# Patient Record
Sex: Female | Born: 1965 | Race: White | Hispanic: No | Marital: Married
Health system: Southern US, Community
[De-identification: ages and names within clinical notes are randomized; demographics above are authoritative.]

## PROBLEM LIST (undated history)

## (undated) HISTORY — PX: APPENDECTOMY: SHX54

---

## 1998-03-06 ENCOUNTER — Other Ambulatory Visit: Admission: RE | Admit: 1998-03-06 | Discharge: 1998-03-06 | Payer: Self-pay | Admitting: Obstetrics and Gynecology

## 1999-03-12 ENCOUNTER — Emergency Department (HOSPITAL_COMMUNITY): Admission: EM | Admit: 1999-03-12 | Discharge: 1999-03-12 | Payer: Self-pay | Admitting: Emergency Medicine

## 1999-07-17 ENCOUNTER — Ambulatory Visit (HOSPITAL_BASED_OUTPATIENT_CLINIC_OR_DEPARTMENT_OTHER): Admission: RE | Admit: 1999-07-17 | Discharge: 1999-07-17 | Payer: Self-pay | Admitting: Plastic Surgery

## 2000-06-05 ENCOUNTER — Encounter (INDEPENDENT_AMBULATORY_CARE_PROVIDER_SITE_OTHER): Payer: Self-pay

## 2000-06-05 ENCOUNTER — Ambulatory Visit (HOSPITAL_COMMUNITY): Admission: RE | Admit: 2000-06-05 | Discharge: 2000-06-05 | Payer: Self-pay | Admitting: Obstetrics and Gynecology

## 2000-06-27 ENCOUNTER — Inpatient Hospital Stay (HOSPITAL_COMMUNITY): Admission: AD | Admit: 2000-06-27 | Discharge: 2000-06-27 | Payer: Self-pay | Admitting: Obstetrics and Gynecology

## 2000-11-26 ENCOUNTER — Inpatient Hospital Stay (HOSPITAL_COMMUNITY): Admission: AD | Admit: 2000-11-26 | Discharge: 2000-11-26 | Payer: Self-pay | Admitting: Obstetrics & Gynecology

## 2000-11-26 ENCOUNTER — Encounter: Payer: Self-pay | Admitting: Obstetrics & Gynecology

## 2001-02-24 ENCOUNTER — Inpatient Hospital Stay (HOSPITAL_COMMUNITY): Admission: AD | Admit: 2001-02-24 | Discharge: 2001-02-24 | Payer: Self-pay | Admitting: Obstetrics and Gynecology

## 2001-04-01 ENCOUNTER — Inpatient Hospital Stay (HOSPITAL_COMMUNITY): Admission: AD | Admit: 2001-04-01 | Discharge: 2001-04-01 | Payer: Self-pay | Admitting: *Deleted

## 2001-04-01 ENCOUNTER — Encounter: Payer: Self-pay | Admitting: *Deleted

## 2001-04-16 ENCOUNTER — Inpatient Hospital Stay (HOSPITAL_COMMUNITY): Admission: AD | Admit: 2001-04-16 | Discharge: 2001-04-18 | Payer: Self-pay | Admitting: Obstetrics and Gynecology

## 2001-04-19 ENCOUNTER — Encounter: Admission: RE | Admit: 2001-04-19 | Discharge: 2001-04-21 | Payer: Self-pay | Admitting: Obstetrics and Gynecology

## 2001-05-27 ENCOUNTER — Other Ambulatory Visit: Admission: RE | Admit: 2001-05-27 | Discharge: 2001-05-27 | Payer: Self-pay | Admitting: Obstetrics and Gynecology

## 2002-06-02 ENCOUNTER — Inpatient Hospital Stay (HOSPITAL_COMMUNITY): Admission: AD | Admit: 2002-06-02 | Discharge: 2002-06-04 | Payer: Self-pay | Admitting: Obstetrics and Gynecology

## 2002-06-08 ENCOUNTER — Inpatient Hospital Stay (HOSPITAL_COMMUNITY): Admission: AD | Admit: 2002-06-08 | Discharge: 2002-06-08 | Payer: Self-pay | Admitting: Obstetrics and Gynecology

## 2002-06-11 ENCOUNTER — Inpatient Hospital Stay (HOSPITAL_COMMUNITY): Admission: AD | Admit: 2002-06-11 | Discharge: 2002-06-11 | Payer: Self-pay | Admitting: Obstetrics and Gynecology

## 2003-01-12 ENCOUNTER — Other Ambulatory Visit: Admission: RE | Admit: 2003-01-12 | Discharge: 2003-01-12 | Payer: Self-pay | Admitting: Obstetrics and Gynecology

## 2004-05-01 ENCOUNTER — Other Ambulatory Visit: Admission: RE | Admit: 2004-05-01 | Discharge: 2004-05-01 | Payer: Self-pay | Admitting: Obstetrics and Gynecology

## 2004-05-31 ENCOUNTER — Encounter: Admission: RE | Admit: 2004-05-31 | Discharge: 2004-05-31 | Payer: Self-pay | Admitting: Family Medicine

## 2005-01-17 ENCOUNTER — Emergency Department (HOSPITAL_COMMUNITY): Admission: EM | Admit: 2005-01-17 | Discharge: 2005-01-17 | Payer: Self-pay | Admitting: Emergency Medicine

## 2005-01-18 ENCOUNTER — Encounter (INDEPENDENT_AMBULATORY_CARE_PROVIDER_SITE_OTHER): Payer: Self-pay | Admitting: *Deleted

## 2005-01-18 ENCOUNTER — Inpatient Hospital Stay (HOSPITAL_COMMUNITY): Admission: EM | Admit: 2005-01-18 | Discharge: 2005-01-21 | Payer: Self-pay | Admitting: Emergency Medicine

## 2005-01-21 ENCOUNTER — Inpatient Hospital Stay (HOSPITAL_COMMUNITY): Admission: AD | Admit: 2005-01-21 | Discharge: 2005-01-27 | Payer: Self-pay | Admitting: General Surgery

## 2005-01-29 ENCOUNTER — Ambulatory Visit (HOSPITAL_COMMUNITY): Admission: RE | Admit: 2005-01-29 | Discharge: 2005-01-29 | Payer: Self-pay | Admitting: General Surgery

## 2007-10-11 ENCOUNTER — Encounter: Admission: RE | Admit: 2007-10-11 | Discharge: 2007-10-11 | Payer: Self-pay | Admitting: Obstetrics and Gynecology

## 2009-01-29 ENCOUNTER — Encounter
Admission: RE | Admit: 2009-01-29 | Discharge: 2009-01-29 | Payer: Self-pay | Admitting: Physical Medicine and Rehabilitation

## 2010-12-29 ENCOUNTER — Encounter: Payer: Self-pay | Admitting: Physical Medicine and Rehabilitation

## 2010-12-29 ENCOUNTER — Encounter: Payer: Self-pay | Admitting: Obstetrics and Gynecology

## 2011-04-25 NOTE — Discharge Summary (Signed)
[facility]  Patient:    Lauren, Sparks Visit Number: [id number] MRN: [id number]          Service Type: MED Location: [facility] Attending Physician:  Frederich Balding Dictated by:   Sharene Skeans, N.P. Admit Date:  06/11/2002 Discharge Date: 06/11/2002                             Discharge Summary  ADMISSION DIAGNOSES: 1. Intrauterine pregnancy at 39 weeks. 2. Previous cesarean delivery with desired repeat.  DISCHARGE DIAGNOSES: 1. Status post cesarean delivery. 2. Viable female infant.  PROCEDURE:  Repeat low transverse cesarean section.  REASON FOR ADMISSION:  Please see dictated H&P.  HOSPITAL COURSE:  The patient was admitted for a scheduled repeat cesarean delivery.  The patient was taken to the operating room where epidural anesthesia was placed.  Because of difficulty obtaining a good level of anesthesia, a local anesthetic was placed below the incisional site.  Still unable to achieve good anesthesia, the patient was repositioned and a spinal anesthesia was placed.  After the patient was comfortable, a low transverse incision was made with delivery of a viable female infant weighing 6 pounds 13 ounces, with Apgars of 8 at one minute and 9 at five minutes.  The patient tolerated the procedure well, and was taken to the recovery room in stable condition.  On postoperative day #1, the patient had good return of bowel function, abdomen was soft, incision was noted to be clean, dry, and intact. Labs revealed a hemoglobin of 12.1, hematocrit of 35.2, and white blood cell count of 12.6.  On postoperative day #2, the patient was doing well, incision was clean, dry, and intact, staples were removed, and the patient was discharged home.  CONDITION ON DISCHARGE:  Good.  DIET:  Regular as tolerated.  ACTIVITY:  No heavy lifting, no driving x2 weeks, no vaginal entry.  FOLLOWUP:  The patient is to follow up in the office in one to  two weeks for an incision check.  DISCHARGE INSTRUCTIONS:  The patient is to call for a temperature greater than 100 degrees, persistent nausea and vomiting, heavy vaginal bleeding, and/or redness or drainage from the incision site.  DISCHARGE MEDICATIONS: 1. Percocet 5/325 mg #30 one or two q.4-6h. p.r.n. pain. 2. Prenatal vitamins one p.o. q.d. 3. Ibuprofen 600 mg q.6h. p.r.n. Dictated by:   Sharene Skeans, N.P. Attending Physician:  Frederich Balding DD:  06/28/02 TD:  07/04/02 Job: [id number]

## 2011-04-25 NOTE — Op Note (Signed)
[facility]  Patient:    [name], [name]                MRN: [id number] Proc. Date: 06/05/00 Adm. Date:  [id number] Attending:  Conley Simmonds A                           Operative Report  PREOPERATIVE DIAGNOSIS:       Missed abortion at 9+[redacted] weeks gestation.  POSTOPERATIVE DIAGNOSIS:      Missed abortion at 9+[redacted] weeks gestation.  OPERATION:                    Examination under anesthesia, dilatation and evacuation.  SURGEON:                      Conley Simmonds, M.D.  ASSISTANT:  ANESTHESIA:                   MAC, paracervical block with 1% lidocaine.  IV FLUIDS:                    1300 cc of Ringers lactate.  ESTIMATED BLOOD LOSS:         Minimal.  URINE OUTPUT:                 150 cc prior to procedure.  COMPLICATIONS:                None.  INDICATIONS:                  The patient was a 45 year old, gravida 4, para 1-0-2-1, Caucasian female at 9+[redacted] weeks gestation by a last menstrual period of March 31, 2000, and a six-week ultrasound, who presented to the office noting decreased breast tenderness.  The patient had experienced some sporadic cramping and no bleeding nor passage of any tissue.  The patient had previously had an OB ultrasound documenting a viable intrauterine pregnancy at [redacted] weeks gestation. The patient was interested in proceeding with chorionic villus sampling and an OB ultrasound was therefore performed to document continued viability of the pregnancy given the patients symptoms.  Ultrasound demonstrated an echogenic focus of 6.1 mm and no evidence of an embryo and no cardiac activity.  The gestational sac was noted to be intact.  The patient was given a diagnosis of a missed abortion, and options for care were discussed with the patient.  She chose to proceed with a dilatation and evacuation after the risks and benefits were reviewed.  The patients blood type was noted to be AB positive and her preoperative  hematocrit  was 37%.  FINDINGS:                     Examination under anesthesia revealed an 8-week size anteverted mobile uterus.  No adnexal masses were appreciated.  A small amount f products of conception were obtained, and the uterus was sounded to a total of  cm.  DESCRIPTION OF PROCEDURE:     The patient was greeted in the preoperative holding area, where a transabdominal ultrasound was performed which again documented the absence of a viable intrauterine pregnancy.  The patient received Cefazolin 1 gram intravenously and she was then escorted to the operating room.  The patient received MAC anesthesia and she was then placed in the dorsal lithotomy position. The patients vagina and perineum were sterilely prepped and draped and the bladder  was I&O catheterized with a red rubber catheter.  An examination under anesthesia was performed and the findings are as noted above.  A speculum was placed inside the vagina, and a single tooth tenaculum was placed on the anterior cervical lip.  A paracervical block was performed with 1% lidocaine and a total of 10 cc were used.  The uterus was then sounded, and the cervix was sequentially dilated to a #25 Pratt dilator.  A #8 cannula was then inserted through the cervix to the level of the uterine fundus, and suction was applied. A catheter was withdrawn while turning in a clockwise fashion.  This was performed an additional time followed by a gentle curettage with a sharp curet in all four quadrants to assure complete removal of the products of conception.  The suction tip cannula was then inserted to the level of the fundus one final time, and any remaining blood clots were removed.  The tissue was sent to pathology.  The instruments were removed from the cervix and the vagina, and the patient was taken out of the dorsal lithotomy position.  She was awakened, and escorted to the recovery room in stable and awake condition.   There were no complications to the procedure.  All sponge, needle, and instrument counts were correct. DD:  06/05/00 TD:  06/06/00 Job: [id number]

## 2011-04-25 NOTE — Discharge Summary (Signed)
[name], [name]       ACCOUNT NO.:  [id number]   MEDICAL RECORD NO.:  [id number]          PATIENT TYPE:  INP   LOCATION:  5024                         FACILITY:  [facility]   PHYSICIAN:  Gabrielle Dare. Janee Morn, M.D.DATE OF BIRTH:  09/18/1966   DATE OF ADMISSION:  01/21/2005  DATE OF DISCHARGE:  01/27/2005                                 DISCHARGE SUMMARY   DISCHARGE DIAGNOSIS:  Abdominal abscess status post laparoscopic  appendectomy.   HISTORY OF PRESENT ILLNESS:  The patient is a 45 year old female who is  postoperative day 3 from laparoscopic appendectomy.  She returned later on  the day of discharge from the hospital because of abdominal distention and  fever.   HOSPITAL COURSE:  She was admitted to the hospital and placed on Ancef and  Flagyl empirically.  She was treated conservatively for an ileus.  The  patient requested a GI consultation with Dr. Loreta Ave.  Dr. Loreta Ave saw her from GI  and she thought the appropriate measures were being taken.  The patient  continued to have some ileus symptoms.  She began having some loose bowel  movements.  C. difficile was negative.  She then developed further flank  pain.  CT scan of the abdomen and pelvis was ordered, showing an abscess in  her right lower quadrant.  This was percutaneously drained by interventional  radiology and she was continued on her antibiotics.  She became afebrile  with white blood cell count of about 13.  She felt much better  symptomatically and was discharged home with her drain in place on Augmentin  on January 27, 2005 with plans to come back for a follow-up CT scan on  Wednesday and removal of her percutaneous drain at that time.  She was  discharged in stable condition.   DIET:  Regular.   ACTIVITY:  No lifting.   DISCHARGE MEDICATIONS:  1.  Augmentin 875 mg p.o. b.i.d.  2.  Percocet p.r.n. for pain.   FOLLOWUP:  CAT scan on Wednesday and possible drain removal.  She was also  given followup with  Dr. Janee Morn in the office.      BET/MEDQ  D:  04/17/2005  T:  04/17/2005  Job:  [id number]

## 2011-04-25 NOTE — Op Note (Signed)
[name], [name]       ACCOUNT NO.:  [id number]   MEDICAL RECORD NO.:  [id number]          PATIENT TYPE:  INP   LOCATION:  5018                         FACILITY:  [facility]   PHYSICIAN:  Gabrielle Dare. Janee Morn, M.D.DATE OF BIRTH:  November 27, 1966   DATE OF PROCEDURE:  01/18/2005  DATE OF DISCHARGE:                                 OPERATIVE REPORT   PREOPERATIVE DIAGNOSIS:  Acute appendicitis.   POSTOPERATIVE DIAGNOSIS:  Acute appendicitis with very inflamed and  edematous but not perforated appendix.   PROCEDURE:  Laparoscopic appendectomy.   SURGEON:  Dr. Violeta Gelinas.   ANESTHESIA:  General.   HISTORY OF PRESENT ILLNESS:  The patient is a 45 year old white female who  developed right lower quadrant abdominal pain Thursday night that persisted  through the night. She was evaluated at the [facility] Emergency Department  with abdominal ultrasound on Friday that was negative. That was followed by  CT scan of the abdomen and pelvis that was read as a 3 cm right ovarian  cyst, no appendicitis.  The patient's pain persisted today and although she  did not have nausea and vomiting, she did have chills and this prompted her  to return to the emergency department. The emergency department physician  evaluated the patient and reviewed her CT scan with the radiologist  who was  on-call today and he read it out as acute appendicitis.   The patient's history and physical exam in my hands, in addition to her CT  scan were all consistent with acute appendicitis. She is brought to the  operating room for emergency appendectomy.   PROCEDURE IN DETAIL:  Informed consent was obtained, the patient received  volume resuscitation prior to coming to the operating room. In addition, her  potassium was supplemented. She was properly identified and brought to the  operating room. General anesthesia was administered.  She had also received  intravenous antibiotics preoperatively.  Her abdomen was  prepped and draped  in a sterile fashion. An infraumbilical incision was made. Subcutaneous  tissues were dissected down revealing the anterior fascia which was divided  sharply. The cavity was then entered under direct vision without difficulty  and 0-Vicryl pursestring suture was placed around the fascial opening. The  Hasson trocar was inserted into the abdomen and the abdomen was insufflated  with carbon dioxide in standard fashion. Under direct vision, a 12 mm left  lower quadrant port and a 5 mm right upper quadrant port were placed. Then  0.25% Marcaine with epinephrine was used at all port sites.   Once this was accomplished, exploration of the abdomen revealed a very  inflamed cecum, especially on the posterolateral portion.  The terminal  ileum and medial portion of the cecum were normal. There was a minimal  amount of bile colored free fluid down in the pelvis with no gross  purulence.  At this time the cecum was gently mobilized from its lateral  peritoneal attachments using blunt and sharp dissection and occasional Bovie  cautery to maintain hemostasis. Once the cecum was mobilized along the right  gutter, the appendix easily came into view. It was very inflamed and there  was allot of edema in the area but no frank perforation. The tip of the  appendix was grasped and dissection was gently carried down to its base.  Once this was adequately accomplished, the mesoappendix was noted to be very  adherent to the appendix itself.   At this time the appendix and the mesoappendix were simultaneously divided  with a 45 mm endoscopic GIA stapler with a vascular load. The appendix was  placed in an EndoCatch bag and removed via the left lower quadrant port  site. The staple line along the cecum was hemostatic and nicely sealed. The  abdomen was then copiously irrigated with over 3 liters of warm saline.  There was no further bleeding in the right lower quadrant area. The  irrigation  fluid was suctioned out and returned clear. Once this was  accomplished, the staple line was rechecked and noted to be nicely intact.  The remainder of the irrigation fluid was removed. The ports were then  removed under direct vision. The Hasson trocar was removed and the abdomen.  The abdomen was evacuated of its insufflation.   The infraumbilical fascia was closed by tying the 0-Vicryl pursestring  suture. All three wounds were copiously irrigated. Some additional Marcaine  local anesthetic was injected and the skin of each was then closed with a  running 4-0 Vicryl subcuticular stitch. Sponge, needle and instrument counts  were all correct. Benzoin, Steri-Strips and sterile dressings were applied.  The patient tolerated the procedure well without apparent complications,  taken to the recovery room in stable condition.      BET/MEDQ  D:  01/18/2005  T:  01/19/2005  Job:  [id number]
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
## 2011-04-25 NOTE — Op Note (Signed)
[facility]  Patient:    [name], [name] Visit Number: [id number] MRN: [id number]          Service Type: OBS Location: [facility] Attending Physician:  Trevor Iha Dictated by:   Trevor Iha, M.D. Proc. Date: 06/02/02                             Operative Report  PREOPERATIVE DIAGNOSES:       Intrauterine pregnancy at 39 weeks, previous cesarean section with desired repeat.  POSTOPERATIVE DIAGNOSES:      Intrauterine pregnancy at 39 weeks, previous cesarean section with desired repeat.  PROCEDURE:                    Repeat low segment transverse cesarean section.  SURGEON:                      Trevor Iha, M.D.  ASSISTANT:                    Duke Salvia. Marcelle Overlie, M.D.  ANESTHESIA:                   Epidural and then spinal and local.  ESTIMATED BLOOD LOSS:         800 cc.  INDICATIONS:                  [name] is a 45 year old G6, P2, A3 at 65 weeks with previous cesarean section with desired repeat.  She presents today for this.  Pregnancy was complicated by advanced maternal age with a normal amniocentesis.  Estimated date of confinement was June 15, 2002.  FINDINGS:                     Viable female infant.  Apgars 8 and 9.  Weight 6 pounds 13 ounces.  DESCRIPTION OF PROCEDURE:     After adequate analgesia, the patient placed in the supine position with left lateral tilt.  Because of difficulty getting good level of anesthesia, local anesthetic was placed below the incision. Still unable to get good anesthetic relief, the patient was repositioned. Spinal was placed and after good anesthesia the patient was placed back in the spinous position with left lateral tilt.  She was sterilely prepped and draped and the previous scar was removed.  The Pfannenstiel skin incision was taken down sharply.  The fascia was incised transversely, extended superiorly and inferiorly off the bellies of the rectus muscle which  were separated sharply in the midline.  Peritoneum was then entered sharply.  Bladder blade placed. Uterine serosa was elevated, nicked, and incised transversely.  Bladder flap was created and placed behind the bladder blade.  Low segment myotomy incision was made down to the amniotic sac.  Clear fluid was noted.  Extended laterally with the operators fingertips.  The infants vertex was delivered atraumatically.  The nares and pharynx were suctioned.  The infant was then delivered.  Cord clamped, cut.  Infant was handed to the pediatrician with good cry noted.  Cord blood was then obtained, the placenta extracted manually.  The uterus was exteriorized, wiped clean with a dry lap.  The myotomy incision was closed in layers, the first being a running locking layer.  The second was a figure-of-eight of 0 Monocryl for good hemostasis. After adequate hemostasis was achieved, the uterus was placed back in its peritoneal  cavity.  After copious amount of irrigation and adequate hemostasis, the peritoneum was then closed with 0 Monocryl.  Rectus muscle plicated in midline.  After adequate hemostasis, the fascia was then closed with 0 PDS in a running fashion.  Irrigation was once again applied and after adequate hemostasis the skin was stapled and Steri-Strips applied.  The patient tolerated procedure well.  Was stable on transfer to the recovery room.  Sponge, needle, and instrument count was normal x3.  The patient received 1 g of Cefotetan after delivery of the placenta. Dictated by:   Trevor Iha, M.D. Attending Physician:  Trevor Iha DD:  06/02/02 TD:  06/03/02 Job: [id number]

## 2011-04-25 NOTE — Discharge Summary (Signed)
NAMEORIANNA, [name]       ACCOUNT NO.:  [id number]   MEDICAL RECORD NO.:  [id number]          PATIENT TYPE:  INP   LOCATION:  5018                         FACILITY:  [facility]   PHYSICIAN:  Gabrielle Dare. Janee Morn, M.D.DATE OF BIRTH:  1966/08/14   DATE OF ADMISSION:  01/18/2005  DATE OF DISCHARGE:  01/21/2005                                 DISCHARGE SUMMARY   DISCHARGE DIAGNOSES:  1.  Acute appendicitis.  2.  Status post laparoscopic appendectomy.   HISTORY OF PRESENT ILLNESS:  The patient is a 45 year old white female who  developed right lower quadrant abdominal pain Thursday night that persisted  through the night. She was evaluated at [facility] emergency department with  an abdominal ultrasound on Friday that was negative. This was followed by a  CT scan of the abdomen and pelvis that was read as a 3-cm right ovarian  cyst, no appendicitis. The patient was discharged from the emergency  department. The patient's pain persisted and she returned on the day of  admission complaining of rigors and chills. Review of her CAT scan by the  emergency department physician and the radiologist on call revealed  consistency with acute appendicitis and I was asked to evaluate her. My  history and physical exam and evaluation of her CAT scan were all consistent  with acute appendicitis. I admitted her and took her emergently to the  operating room for laparoscopic appendectomy.   HOSPITAL COURSE:  The patient underwent an uncomplicated laparoscopic  appendectomy. She did have some significant cecal inflammation and a  retrocecal appendix which required some cecal mobilization. Postoperatively  as expected she did have some ileus. I kept her on IV antibiotics which were  cefotetan until her white count and fever normalized. She had some mild  hypokalemia which was treated with supplementation and otherwise remained  hemodynamically stable. Her bowel function returned by postoperative day two  and she tolerated advancement of her diet. The pain was controlled nicely  with oral medications and she is discharged home today on postoperative day  three.   DISCHARGE DIET:  Regular.   DISCHARGE ACTIVITY:  No lifting.   DISCHARGE MEDICATIONS:  1.  Percocet 5/325 mg one to two p.o. q.6h. p.r.n. pain.  2.  Ciprofloxacin 500 mg one p.o. b.i.d. times five days.   FOLLOWUP:  With Dr. Violeta Gelinas in three weeks.      BET/MEDQ  D:  01/21/2005  T:  01/21/2005  Job:  [id number]

## 2011-04-25 NOTE — H&P (Signed)
[facility]  Patient:    [name], [name]                      MRN: [id number] Adm. Date:  04/16/01 Attending:  Trevor Iha, M.D.                         History and Physical  DATE OF BIRTH:                02-06-1966  HISTORY OF PRESENT ILLNESS:   [name] is a 45 year old G5, P1, A3, at [redacted] weeks gestational age who presents for primary low segment transverse cesarean section.  She has a history of a 17-hour labor for a 6 pound infant with a very difficult delivery.  Estimated fetal weight two weeks ago for this infant was 7-1/2 pounds.  Because of larger size that previous child with difficult delivery, the patient declines vaginal delivery and presents for a primary low segment transverse cesarean section.  Estimated date of confinement is Apr 25, 2001.  Pregnancy was complicated by advanced maternal age.  Normal amniocentesis and normal ultrasound were performed.  PHYSICAL EXAMINATION:  VITAL SIGNS:                  Blood pressure 102/68.  HEART:                        Regular rate and rhythm.  LUNGS:                        Clear to auscultation bilaterally.  ABDOMEN:                      Gravid, nontender.  PELVIC:                       Cervix exam is 1.5 cm, 75%, -3 station.  PAST MEDICAL HISTORY:         Negative.  PAST SURGICAL HISTORY:        She had a breast augmentation and implants removed.  She has had three D&Es.  MEDICATIONS:                  Prenatal vitamins.  ALLERGIES:                    SULFA gives her a rash.  IMPRESSION AND PLAN:          Intrauterine pregnancy at 39 weeks.  Estimated fetal weight around 8-1/2 pounds, previous child 6-1/2 pounds with a very difficult and prolong delivery.  Plan primary low segment transverse cesarean section. The risks and benefits were discussed at length.  Informed consent was obtained. DD:  04/15/01 TD:  04/15/01 Job: [id number]

## 2011-04-25 NOTE — H&P (Signed)
NAMEKORY, [name]       ACCOUNT NO.:  [id number]   MEDICAL RECORD NO.:  [id number]          PATIENT TYPE:  INP   LOCATION:  1824                         FACILITY:  [facility]   PHYSICIAN:  Gabrielle Dare. Janee Morn, M.D.DATE OF BIRTH:  1966-12-03   DATE OF ADMISSION:  01/18/2005  DATE OF DISCHARGE:                                HISTORY & PHYSICAL   CHIEF COMPLAINT:  Right lower quadrant abdominal pain.   HISTORY OF PRESENT ILLNESS:  The patient is a 45 year old white female with  acute onset of right lower quadrant abdominal pain on Thursday night. She  was evaluated Friday in the [facility] emergency department and abdominal  ultrasound at that time was completed and read as negative. She went on to  have CT scan of the abdomen and pelvis. This was read as a 3-cm right  ovarian cyst and no appendicitis. The patient was discharged from the  emergency department. Pain persisted today. She did not have any associated  nausea and vomiting and actually has been tolerating some oral intake, but  she did develop some chills earlier and this prompted her return to the  emergency department this afternoon. The emergency department physician  evaluated her, reviewed her CAT scan from yesterday with the radiologist who  is currently on call. The radiologist today has read the film as consistent  with acute appendicitis. The patient is very anxious about this diagnosis  and does mention that she used to work for [employer].   PAST MEDICAL HISTORY:  Anxiety.   PAST SURGICAL HISTORY:  1.  Breast augmentation implants.  2.  Removal of breast augmentation implants.  3.  Cesarean section times two.  4.  Tonsillectomy.   SOCIAL HISTORY:  The patient has three children and is married. Her first  husband passed away in May 17, 2001.   ALLERGIES:  SULFA which causes rash and itching.   PHYSICAL EXAMINATION:  VITAL SIGNS: Temperature 101.9, blood pressure  96/58,  pulse 120, respirations 32.  GENERAL: She is awake, anxious, and in mild distress.  HEENT: Pupils are equal and reactive. Sclerae is clear with no icterus.  NECK: Supple. No tenderness or masses.  LUNGS: Clear to auscultation bilaterally.  HEART: Regular rate and rhythm with no murmur. Distal pulses are equal.  Regular rate and rhythm with no murmurs. PMI is palpable in the left chest.  ABDOMEN: Mildly distended. Bowel sounds are present. She has tenderness with  guarding and rebound in the right lower quadrant. She has no psoas sign. She  has a positive Rovsing's sign. The remainder of her abdomen is nontender.  SKIN: Warm and dry.   DATA REVIEWED:  I looked at the CT scan with the radiologist who is present  today and we both agree it is consistent with acute appendicitis.   IMPRESSION AND PLAN:  A 45 year old white female with acute appendicitis.  Plan will be take her to the operating room for laparoscopic, possible open,  appendectomy. The procedure, risks, and benefits including but not limited  to bleeding, infection, conversion to open procedure, and other necessary  procedures were discussed in detail with  the patient and her husband.  Questions were answered. Due to the fact that she ate approximately two and  a half hours ago we will wait a couple of hours so she can tolerate the  general anesthetic, per discussion with the anesthesiologist, and in the  interim give her IV fluid bolus one liter and intravenous antibiotics.      BET/MEDQ  D:  01/18/2005  T:  01/18/2005  Job:  [id number]

## 2011-04-25 NOTE — Op Note (Signed)
[facility]  Patient:    [name], [name]                MRN: [id number] Proc. Date: 04/16/01 Adm. Date:  [id number] Attending:  Trevor Iha                           Operative Report  PREOPERATIVE DIAGNOSES:       1. Intrauterine pregnancy at 39 weeks.                               2. Previous difficult delivery with a smaller                                  child.  POSTOPERATIVE DIAGNOSES:      1. Intrauterine pregnancy at 39 weeks.                               2. Previous difficult delivery with a smaller                                  child.  PROCEDURE:                    Primary low segment transverse cesarean section.  SURGEON:                      Trevor Iha, M.D.  ASSISTANT:                    Jamey Reas, M.D.  ANESTHESIA:                   Spinal.  ESTIMATED BLOOD LOSS:         800 cc.  INDICATIONS:                  Ms. Honda is a 45 year old, G5, P1, A3, at 68 weeks estimated gestational age with history of a difficult delivery 18 years ago with a 6 pound infant. Ultrasound shows this child to be at least 7-1/2 pounds. The patient declines vaginal trial of labor, and requests a primary cesarean section. Risks and benefits are discussed. Informed consent was obtained. See History and Physical for the details.  FINDINGS:                     A viable female infant, Apgars were 9 and 9, pH was 7.33, weight is 7 pounds 7 ounces.  DESCRIPTION OF PROCEDURE:     After adequate analgesia, the patient was placed in the supine position with left lateral tilt. She was sterilely prepped and draped. A Foley catheter was sterilely placed. A Pfannenstiel skin incision was made two fingerbreadths above the pubic symphysis and was taken down sharply. The fascia was incised transversely ______ superiorly and inferiorly off the bellies of the rectus muscles. The rectus muscle was separated sharply in the midline. The  peritoneum was entered sharply, extended laterally. A bladder flap was created, elevated in the uterine serosa, placed behind the bladder blade. A low segment myotomy incision was made down to the infants vertex, it was extended laterally with the operators fingertips. The  infants vertex was easily delivered. The nares and pharynx were suctioned. The infant was then delivered, cord clamped, and handed to the pediatricians. Cord blood was obtained. The placenta was extracted manually. The uterus was exteriorized and wiped clean with a dry lap. The myotomy incision was closed in two layers, the first being a running locking layer, the second being an imbricating layer, using 0 Monocryl. Normal uterus, tubes, and ovaries were noted. The uterus was placed back in the peritoneal cavity, and after a copious amount of irrigation and adequate hemostasis was assured, the peritoneum was closed with 0 Monocryl. The rectus muscle was plicated in the midline. Irrigation was once again applied, and after adequate hemostasis, the fascia was closed with 0 Panacryl in a running fashion. The irrigation was again applied, and after adequate hemostasis, the skin was stapled with Steri-Strips applied. The patient received Cefotetan 1 g after delivery of placenta. Sponge, needle, and instrument count was normal x 3. The patient was stable and transferred to recovery room. The estimated blood loss was 800 cc. DD:  04/16/01 TD:  04/16/01 Job: [id number]

## 2011-04-25 NOTE — H&P (Signed)
[name], CHAR NO.:  [id number]   MEDICAL RECORD NO.:  [id number]          PATIENT TYPE:  INP   LOCATION:                               FACILITY:  [facility]   PHYSICIAN:  Adolph Pollack, M.D.DATE OF BIRTH:  March 24, 1966   DATE OF ADMISSION:  01/21/2005  DATE OF DISCHARGE:                                HISTORY & PHYSICAL   CHIEF COMPLAINT:  Increased abdominal bloating and discomfort.   HISTORY OF PRESENT ILLNESS:  This is a 45 year old female who underwent  laparoscopic appendectomy January 18, 2005, for acute appendicitis.  She  was discharged this morning after demonstrating some bowel function, being  able to tolerate a diet.  She comes back late this afternoon feeling worse  with increasing bloating, increased pain.   PAST MEDICAL HISTORY:  Anxiety disorder.   PAST SURGICAL HISTORY:  1.  Cesarean section.  2.  Tonsillectomy.  3.  Breast augmentation.  4.  Removal of breast implants.   ALLERGIES:  SULFA causes a rash.   SOCIAL HISTORY:  Married, children.   PHYSICAL EXAMINATION:  GENERAL:  A very ill-appearing female.  VITAL SIGNS:  Temperature 101.5.  LUNGS: Equal breath sounds.  Clear to auscultation.  HEART:  Regular rate and rhythm.  ABDOMEN:  Firm and distended, quite, clean incisions noted with a little bit  of ecchymosis around it.  EXTREMITIES:  No significant lower extremity edema present.   IMPRESSION:  Postoperative ileus with possible infection as well.   PLAN:  1.  Will re-admit to the hospital.  2.  Start empiric antibiotics.  3.  IV fluid hydration.  4.  Bowel rest.      TJR/MEDQ  D:  01/21/2005  T:  01/21/2005  Job:  [id number]

## 2011-04-25 NOTE — Discharge Summary (Signed)
[facility]  Patient:    [name], [name]                MRN: [id number] Adm. Date:  04/16/01 Disc. Date: 04/18/01 Attending:  Trevor Iha, M.D. Dictator:   Danie Chandler, R.N.                           Discharge Summary  ADMITTING DIAGNOSES:          1. Intrauterine pregnancy at [redacted] weeks gestation.                               2. Previous difficult delivery with a smaller                                  child.  DISCHARGE DIAGNOSES:          1. Intrauterine pregnancy at [redacted] weeks gestation.                               2. Previous difficult delivery with a smaller                                  child.  PROCEDURE:                    On Apr 16, 2001 primary low segment transverse cesarean section.  REASON FOR ADMISSION:         Please see dictated H&P.  HOSPITAL COURSE:              The patient was taken to the operating room and underwent the above named procedure without complication.  This was productive of a viable female infant with Apgars of 9 at one minute and 9 at five minutes and an arterial cord pH of 7.33.  Postoperatively on day #1 the patients hemoglobin was 11.0, hematocrit 32.0, and white blood cell count 11.6.  The patient was without complaint on this day.  She was ambulating well without difficulty and had a good return of bowel function.  On postoperative day #2 she had good pain control and was tolerating a regular diet.  She requested discharge home on this day.  CONDITION ON DISCHARGE:       Good.  DIET:                         Regular, as tolerated.  ACTIVITY:                     No heavy lifting, no driving, no vaginal entry.  FOLLOW-UP:                    In the office for staple removal.  She is to call for temperature greater than 100 degrees, persistent nausea or vomiting, heavy vaginal bleeding, and/or redness or drainage from the incision site.  DISCHARGE MEDICATIONS:        1. Prenatal vitamins  one p.o. q.d.                               2.  Tylox one to two p.o. q.4h. p.r.n. pain.                               3. Motrin 600 mg one p.o. q.6h. p.r.n. pain. DD:  04/30/01 TD:  04/30/01 Job: [id number]

## 2015-03-30 ENCOUNTER — Other Ambulatory Visit: Payer: Self-pay | Admitting: Obstetrics and Gynecology

## 2015-03-30 DIAGNOSIS — N6489 Other specified disorders of breast: Secondary | ICD-10-CM

## 2015-03-30 DIAGNOSIS — Z1231 Encounter for screening mammogram for malignant neoplasm of breast: Secondary | ICD-10-CM

## 2015-03-30 DIAGNOSIS — S2000XA Contusion of breast, unspecified breast, initial encounter: Secondary | ICD-10-CM

## 2015-04-12 ENCOUNTER — Other Ambulatory Visit: Payer: Self-pay

## 2015-04-18 ENCOUNTER — Ambulatory Visit
Admission: RE | Admit: 2015-04-18 | Discharge: 2015-04-18 | Disposition: A | Discharge status: Home / Self Care | Payer: BLUE CROSS/BLUE SHIELD | Source: Ambulatory Visit | Attending: Obstetrics and Gynecology | Admitting: Obstetrics and Gynecology

## 2015-04-18 ENCOUNTER — Other Ambulatory Visit: Payer: Self-pay | Admitting: Obstetrics and Gynecology

## 2015-04-18 DIAGNOSIS — S2000XA Contusion of breast, unspecified breast, initial encounter: Secondary | ICD-10-CM

## 2015-04-18 DIAGNOSIS — N6489 Other specified disorders of breast: Secondary | ICD-10-CM

## 2015-05-10 ENCOUNTER — Other Ambulatory Visit: Payer: Self-pay | Admitting: Orthopedic Surgery

## 2015-06-18 ENCOUNTER — Ambulatory Visit (HOSPITAL_BASED_OUTPATIENT_CLINIC_OR_DEPARTMENT_OTHER)
Admission: RE | Admit: 2015-06-18 | Payer: BLUE CROSS/BLUE SHIELD | Source: Ambulatory Visit | Admitting: Orthopedic Surgery

## 2015-06-18 ENCOUNTER — Encounter (HOSPITAL_BASED_OUTPATIENT_CLINIC_OR_DEPARTMENT_OTHER): Admission: RE | Payer: Self-pay | Source: Ambulatory Visit

## 2015-06-18 SURGERY — SHOULDER ARTHROSCOPY WITH SUBACROMIAL DECOMPRESSION
Anesthesia: Choice | Laterality: Left

## 2019-07-29 ENCOUNTER — Other Ambulatory Visit: Payer: Self-pay | Admitting: Internal Medicine

## 2020-09-26 ENCOUNTER — Other Ambulatory Visit: Payer: Self-pay | Admitting: Obstetrics and Gynecology

## 2020-09-26 DIAGNOSIS — R928 Other abnormal and inconclusive findings on diagnostic imaging of breast: Secondary | ICD-10-CM

## 2020-10-05 ENCOUNTER — Other Ambulatory Visit: Payer: Self-pay | Admitting: Obstetrics and Gynecology

## 2020-10-05 ENCOUNTER — Ambulatory Visit
Admission: RE | Admit: 2020-10-05 | Discharge: 2020-10-05 | Disposition: A | Discharge status: Home / Self Care | Payer: BC Managed Care – PPO | Source: Ambulatory Visit | Attending: Obstetrics and Gynecology | Admitting: Obstetrics and Gynecology

## 2020-10-05 ENCOUNTER — Other Ambulatory Visit: Payer: Self-pay

## 2020-10-05 DIAGNOSIS — R928 Other abnormal and inconclusive findings on diagnostic imaging of breast: Secondary | ICD-10-CM

## 2020-10-08 ENCOUNTER — Other Ambulatory Visit: Payer: BLUE CROSS/BLUE SHIELD

## 2021-08-09 ENCOUNTER — Other Ambulatory Visit: Payer: Self-pay | Admitting: Obstetrics and Gynecology

## 2021-08-09 DIAGNOSIS — Z1231 Encounter for screening mammogram for malignant neoplasm of breast: Secondary | ICD-10-CM

## 2021-08-26 ENCOUNTER — Emergency Department (HOSPITAL_COMMUNITY)
Admission: EM | Admit: 2021-08-26 | Discharge: 2021-08-26 | Disposition: A | Discharge status: Home / Self Care | Payer: BC Managed Care – PPO | Attending: Emergency Medicine | Admitting: Emergency Medicine

## 2021-08-26 ENCOUNTER — Encounter (HOSPITAL_COMMUNITY): Payer: Self-pay | Admitting: Emergency Medicine

## 2021-08-26 ENCOUNTER — Emergency Department (HOSPITAL_COMMUNITY): Payer: BC Managed Care – PPO

## 2021-08-26 ENCOUNTER — Other Ambulatory Visit: Payer: Self-pay

## 2021-08-26 DIAGNOSIS — Y92481 Parking lot as the place of occurrence of the external cause: Secondary | ICD-10-CM | POA: Diagnosis not present

## 2021-08-26 DIAGNOSIS — M25532 Pain in left wrist: Secondary | ICD-10-CM | POA: Diagnosis not present

## 2021-08-26 DIAGNOSIS — M542 Cervicalgia: Secondary | ICD-10-CM | POA: Insufficient documentation

## 2021-08-26 DIAGNOSIS — I10 Essential (primary) hypertension: Secondary | ICD-10-CM | POA: Insufficient documentation

## 2021-08-26 DIAGNOSIS — R079 Chest pain, unspecified: Secondary | ICD-10-CM | POA: Diagnosis not present

## 2021-08-26 MED ORDER — ACETAMINOPHEN 500 MG PO TABS
1000.0000 mg | ORAL_TABLET | Freq: Once | ORAL | Status: AC
  Administered 2021-08-26: 1000 mg via ORAL
  Filled 2021-08-26: qty 2
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
## 2021-08-26 NOTE — ED Notes (Signed)
Patient transported to X-ray 

## 2021-09-23 ENCOUNTER — Other Ambulatory Visit: Payer: Self-pay

## 2021-09-23 ENCOUNTER — Ambulatory Visit
Admission: RE | Admit: 2021-09-23 | Discharge: 2021-09-23 | Disposition: A | Discharge status: Home / Self Care | Payer: BC Managed Care – PPO | Source: Ambulatory Visit | Attending: Obstetrics and Gynecology | Admitting: Obstetrics and Gynecology

## 2021-09-23 DIAGNOSIS — Z1231 Encounter for screening mammogram for malignant neoplasm of breast: Secondary | ICD-10-CM

## 2021-10-29 IMAGING — MG MM DIGITAL SCREENING BILAT W/ TOMO AND CAD
8 series · 8 of 24 positions shown · non-contrast
Comparison: Previous exam(s).

CLINICAL DATA: Screening.

EXAM:
DIGITAL SCREENING BILATERAL MAMMOGRAM WITH TOMOSYNTHESIS AND CAD
TECHNIQUE: Bilateral screening digital craniocaudal and mediolateral oblique
mammograms were obtained. Bilateral screening digital breast
tomosynthesis was performed. The images were evaluated with
computer-aided detection.

[R CC synth-2D]
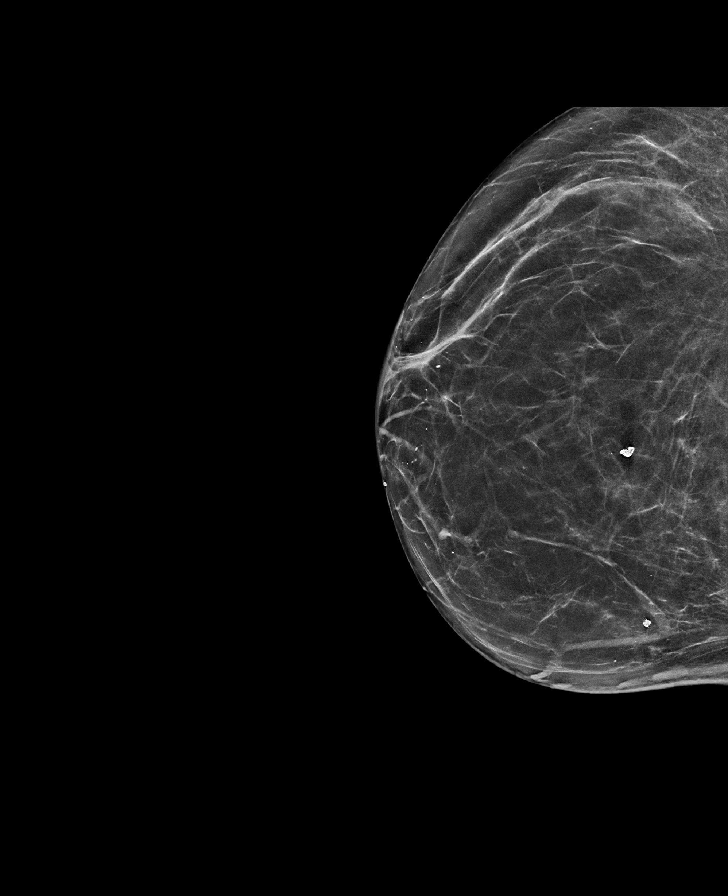

[L MLO synth-2D]
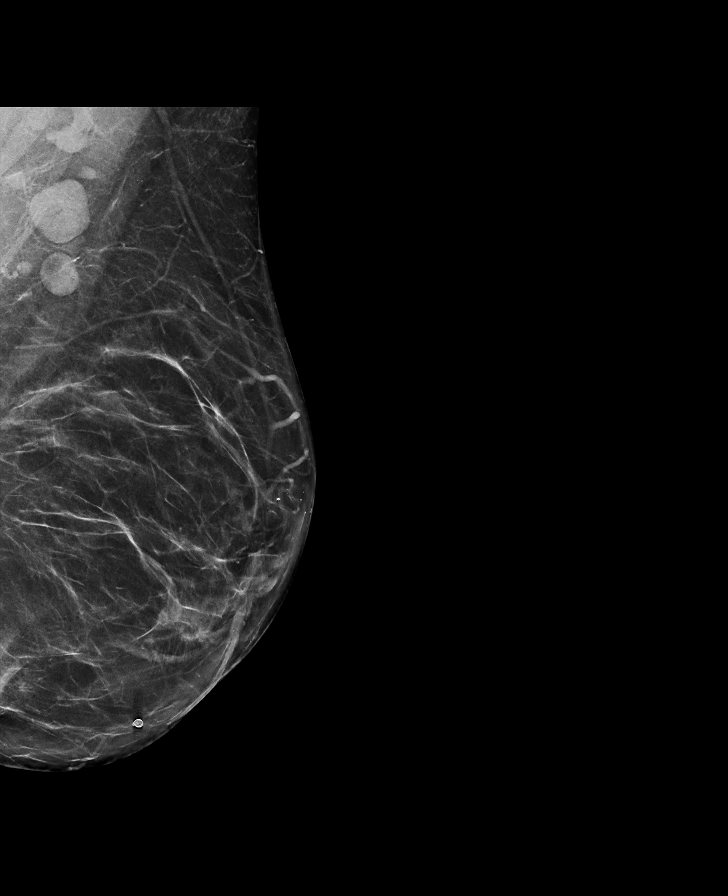

[L CC synth-2D]
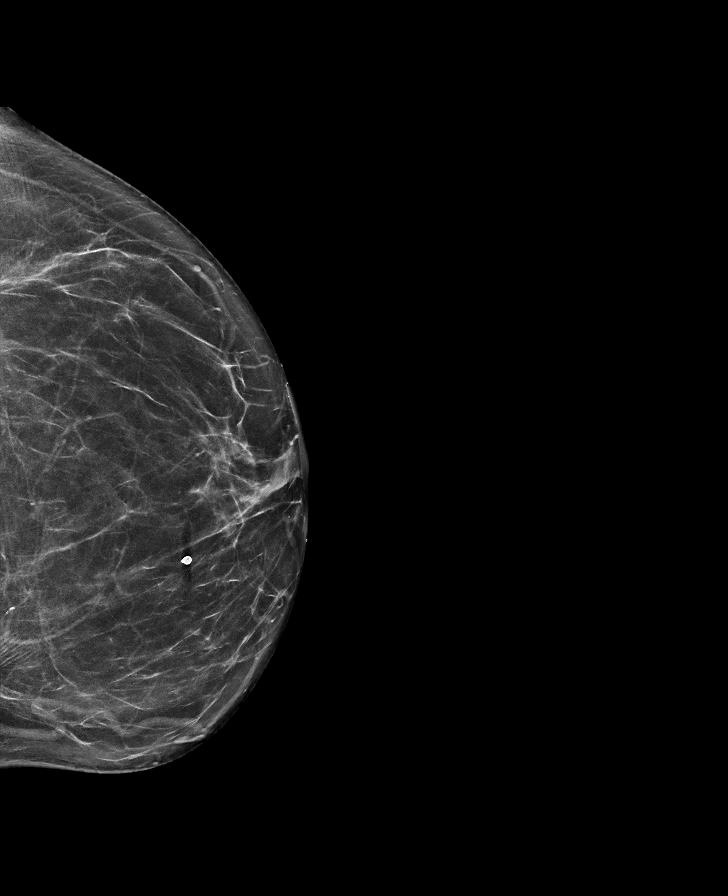

[R MLO synth-2D]
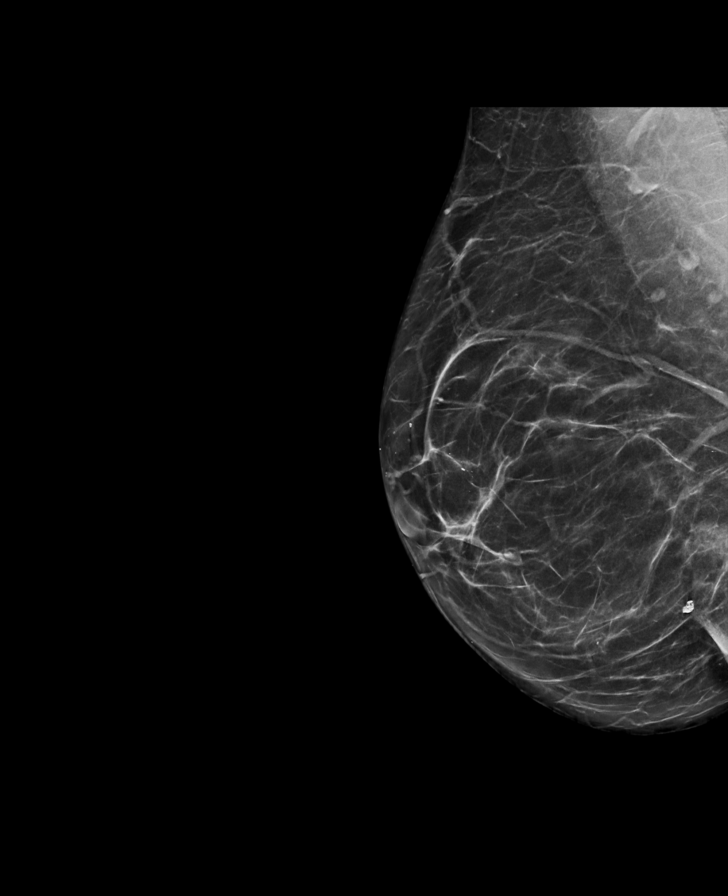

[L CC tomo · tomo slice 35/68.0]
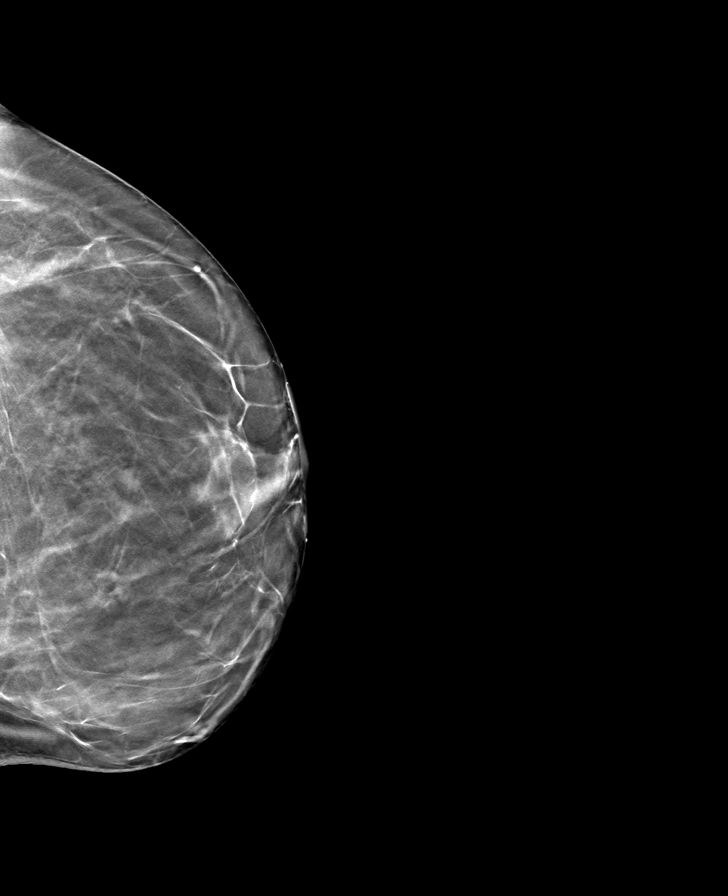

[L MLO tomo · tomo slice 40/79.0]
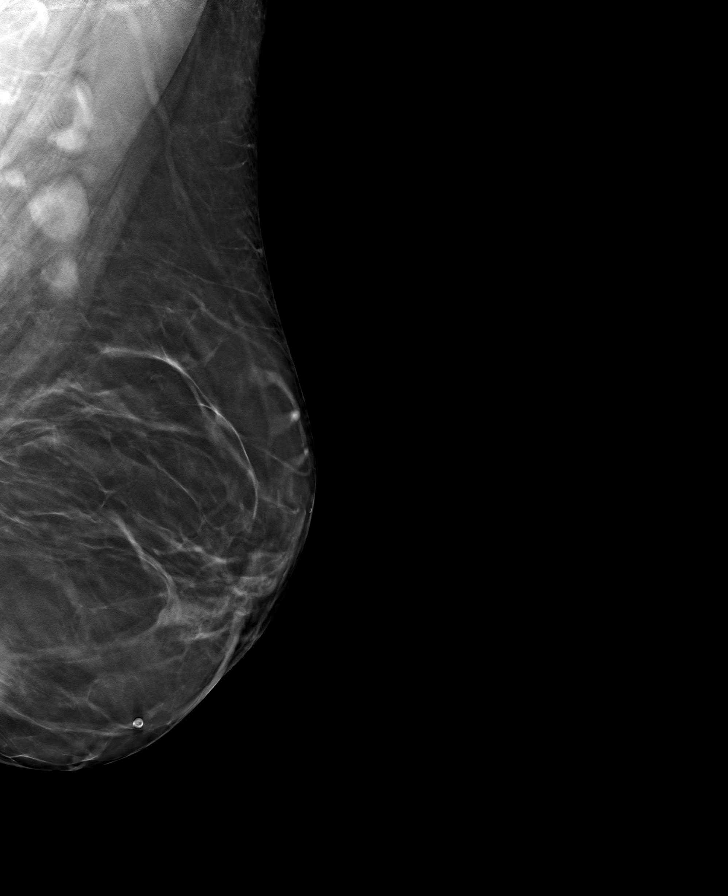

[R CC tomo · tomo slice 34/67.0]
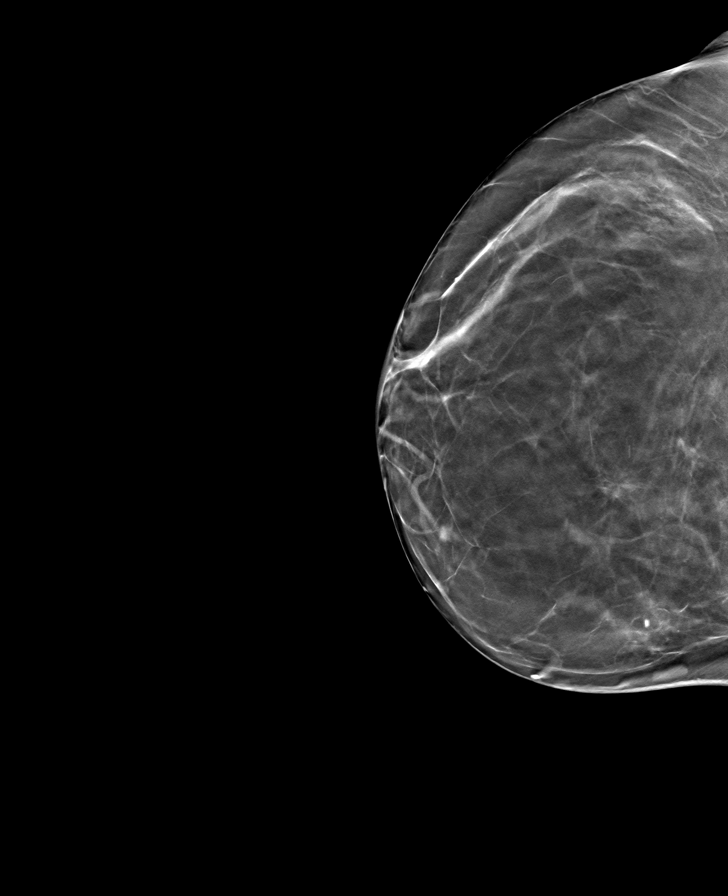

[R MLO tomo · tomo slice 39/77.0]
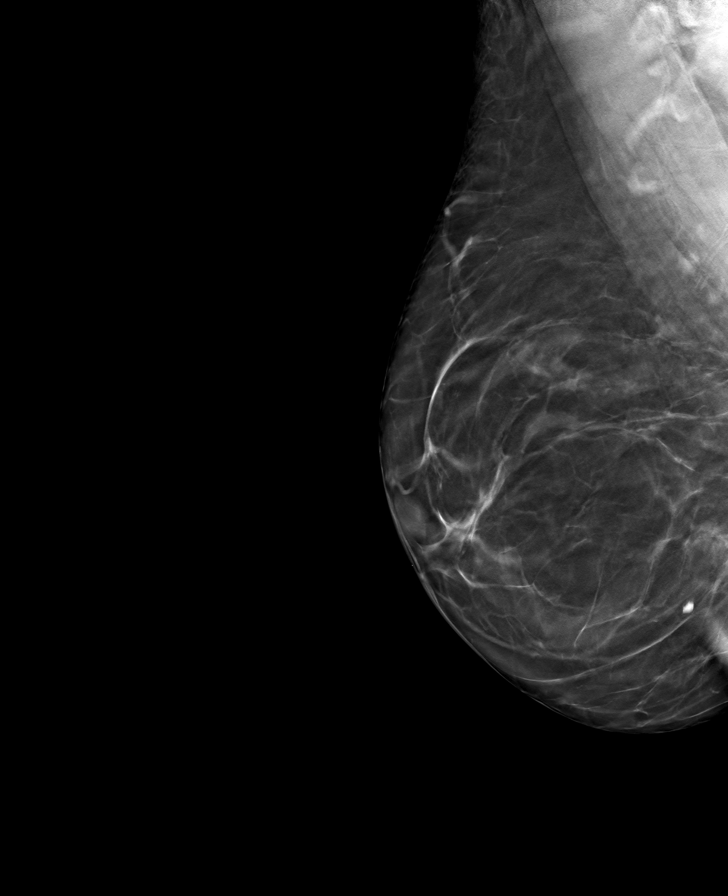

[8 of 24 positions shown; findings below may reference images not displayed]

ACR Breast Density Category b: There are scattered areas of
fibroglandular density.
FINDINGS: There are no findings suspicious for malignancy.
IMPRESSION: No mammographic evidence of malignancy. A result letter of this
screening mammogram will be mailed directly to the patient.

RECOMMENDATION:
Screening mammogram in one year. (Code:51-O-LD2)

BI-RADS CATEGORY  1: Negative.

## 2022-03-29 ENCOUNTER — Other Ambulatory Visit (HOSPITAL_COMMUNITY): Payer: Self-pay | Admitting: Orthopedic Surgery

## 2022-03-29 ENCOUNTER — Other Ambulatory Visit: Payer: Self-pay | Admitting: Orthopedic Surgery

## 2022-03-29 DIAGNOSIS — M533 Sacrococcygeal disorders, not elsewhere classified: Secondary | ICD-10-CM

## 2022-03-29 DIAGNOSIS — M545 Low back pain, unspecified: Secondary | ICD-10-CM

## 2022-04-03 ENCOUNTER — Ambulatory Visit (HOSPITAL_COMMUNITY)
Admission: RE | Admit: 2022-04-03 | Discharge: 2022-04-03 | Disposition: A | Discharge status: Home / Self Care | Payer: BC Managed Care – PPO | Source: Ambulatory Visit | Attending: Orthopedic Surgery | Admitting: Orthopedic Surgery

## 2022-04-03 ENCOUNTER — Encounter (HOSPITAL_COMMUNITY): Payer: Self-pay

## 2022-04-03 DIAGNOSIS — M545 Low back pain, unspecified: Secondary | ICD-10-CM

## 2022-04-03 DIAGNOSIS — M533 Sacrococcygeal disorders, not elsewhere classified: Secondary | ICD-10-CM

## 2022-04-04 ENCOUNTER — Other Ambulatory Visit: Payer: Self-pay | Admitting: Orthopedic Surgery

## 2022-04-04 DIAGNOSIS — M533 Sacrococcygeal disorders, not elsewhere classified: Secondary | ICD-10-CM

## 2022-04-04 DIAGNOSIS — M545 Low back pain, unspecified: Secondary | ICD-10-CM

## 2022-04-14 ENCOUNTER — Ambulatory Visit: Payer: BC Managed Care – PPO

## 2023-04-21 ENCOUNTER — Other Ambulatory Visit (HOSPITAL_BASED_OUTPATIENT_CLINIC_OR_DEPARTMENT_OTHER): Payer: Self-pay

## 2023-04-21 MED ORDER — WEGOVY 0.25 MG/0.5ML ~~LOC~~ SOAJ
0.2500 mg | SUBCUTANEOUS | 0 refills | Status: AC
  Filled 2023-04-21: qty 2, 28d supply, fill #0
